# Patient Record
Sex: Male | Born: 1990 | Race: Black or African American | Hispanic: No | Marital: Single | Smoking: Never smoker
Health system: Southern US, Community
[De-identification: ages and names within clinical notes are randomized; demographics above are authoritative.]

## PROBLEM LIST (undated history)

## (undated) DIAGNOSIS — I861 Scrotal varices: Secondary | ICD-10-CM

## (undated) HISTORY — DX: Scrotal varices: I86.1

---
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
## 2011-04-11 ENCOUNTER — Ambulatory Visit (INDEPENDENT_AMBULATORY_CARE_PROVIDER_SITE_OTHER): Payer: Self-pay

## 2011-04-11 ENCOUNTER — Inpatient Hospital Stay (INDEPENDENT_AMBULATORY_CARE_PROVIDER_SITE_OTHER)
Admission: RE | Admit: 2011-04-11 | Discharge: 2011-04-11 | Disposition: A | Discharge status: Home / Self Care | Payer: Self-pay | Source: Ambulatory Visit | Attending: Family Medicine | Admitting: Family Medicine

## 2011-04-11 DIAGNOSIS — S92109A Unspecified fracture of unspecified talus, initial encounter for closed fracture: Secondary | ICD-10-CM

## 2011-04-11 DIAGNOSIS — M79609 Pain in unspecified limb: Secondary | ICD-10-CM

## 2012-12-19 IMAGING — CR DG ANKLE COMPLETE 3+V*L*
3 series · 3 of 3 positions shown · non-contrast
Comparison: None.

CLINICAL DATA: Trauma/fall, lateral ankle pain

LEFT ANKLE COMPLETE - 3+ VIEW

[view not recorded (1 of 3)]
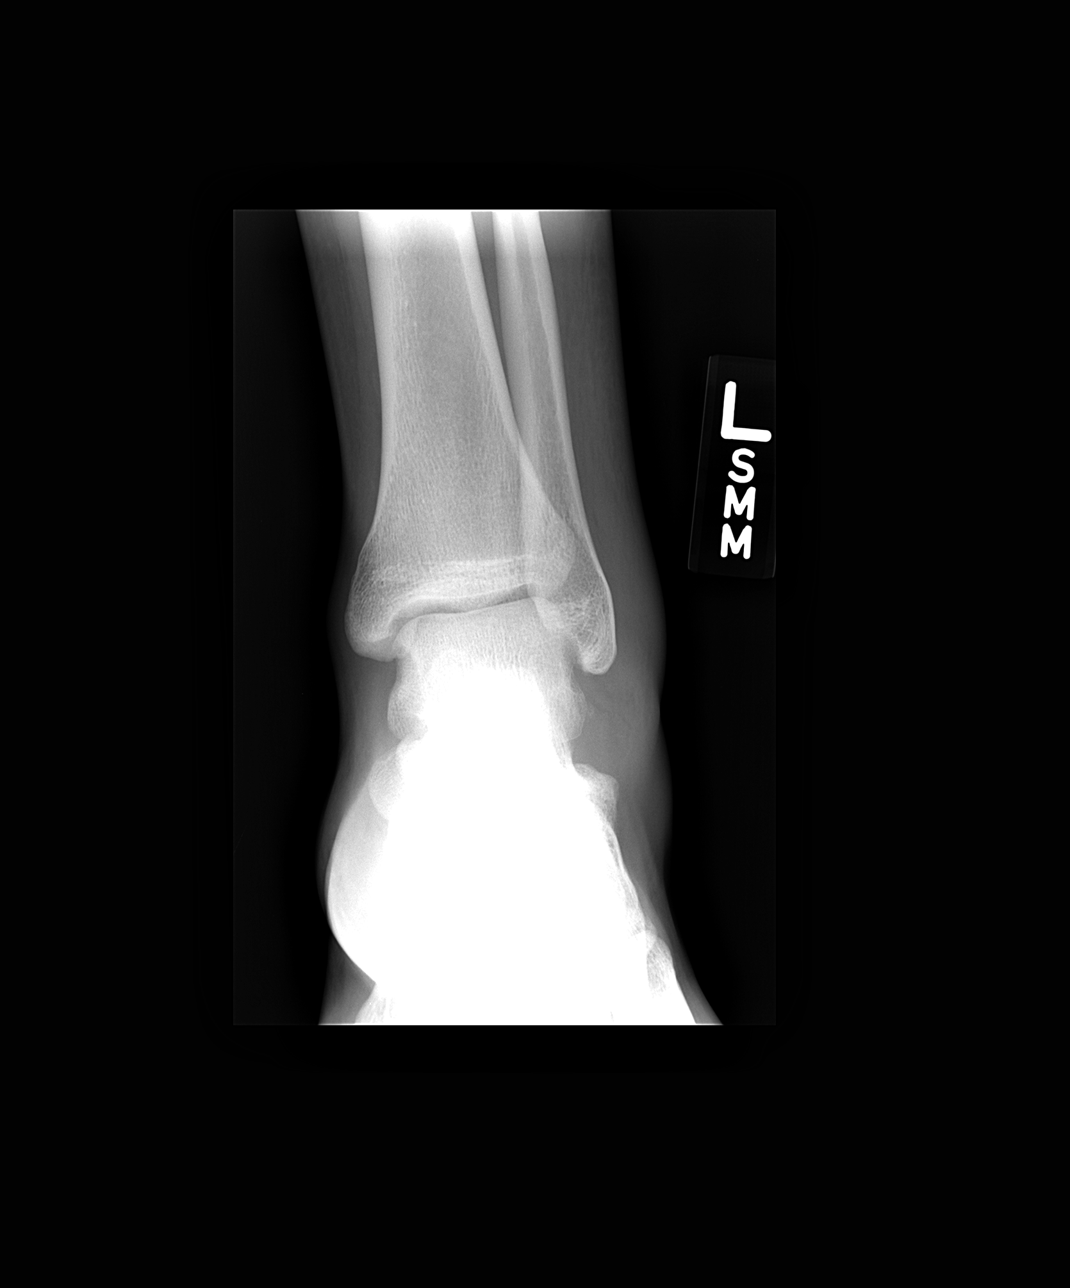

[view not recorded (2 of 3)]
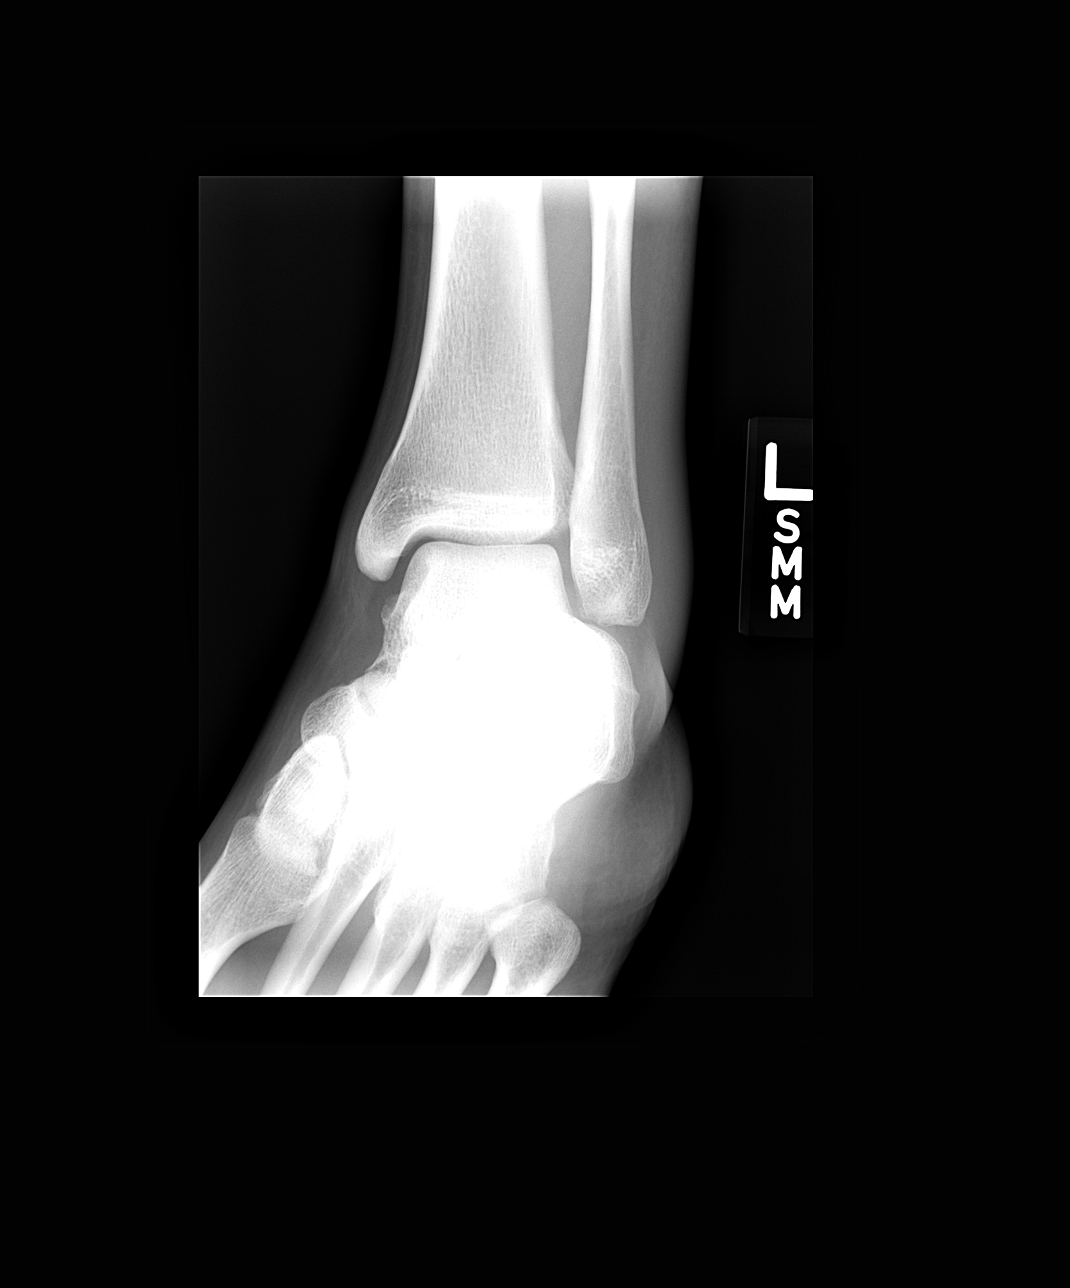

[view not recorded (3 of 3)]
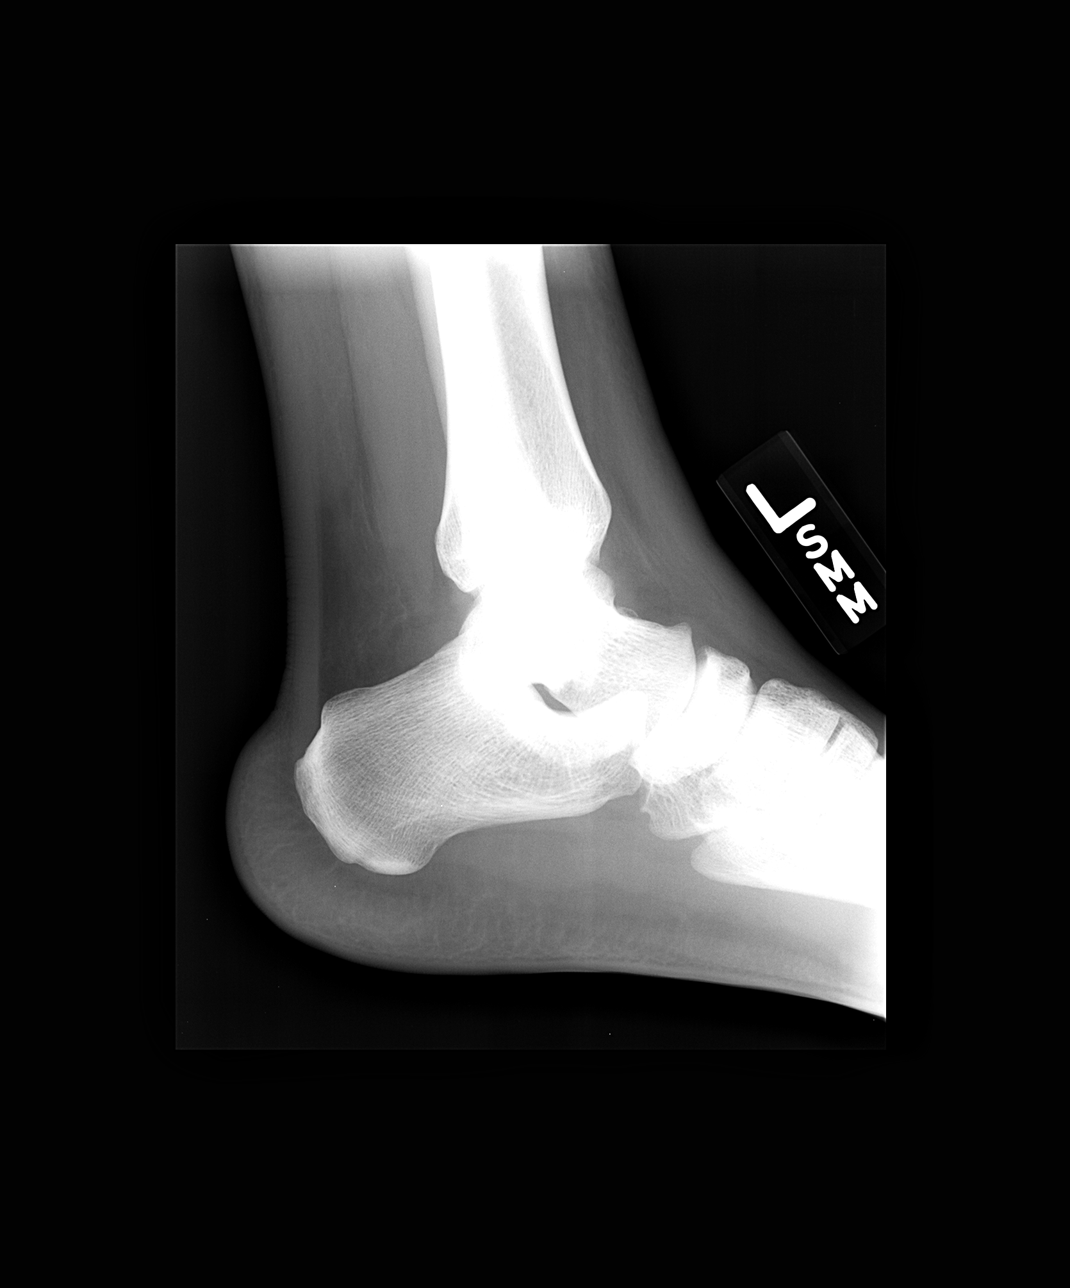

[3 of 3 positions shown; findings below may reference images not displayed]

FINDINGS: Tiny osseous density along the lateral aspect of the
talus, only visible on the frontal view, which could reflect a very
small avulsion fracture.  Associated soft tissue swelling.

Otherwise, no fracture is seen.  The ankle mortise is preserved.
The base of the fifth metatarsal appears intact.
IMPRESSION: Suspected tiny avulsion fracture along the lateral aspect of the
talus.

Associated soft tissue swelling along the lateral ankle.

## 2013-08-15 ENCOUNTER — Ambulatory Visit (INDEPENDENT_AMBULATORY_CARE_PROVIDER_SITE_OTHER): Payer: BC Managed Care – PPO | Admitting: Medical

## 2013-08-15 ENCOUNTER — Encounter: Payer: Self-pay | Admitting: Medical

## 2013-08-15 VITALS — BP 110/80 | HR 58 | Temp 98.2°F | Resp 16 | Ht 68.2 in | Wt 208.0 lb

## 2013-08-15 DIAGNOSIS — I861 Scrotal varices: Secondary | ICD-10-CM

## 2013-08-15 DIAGNOSIS — Z113 Encounter for screening for infections with a predominantly sexual mode of transmission: Secondary | ICD-10-CM

## 2013-08-15 DIAGNOSIS — Z0000 Encounter for general adult medical examination without abnormal findings: Secondary | ICD-10-CM

## 2013-08-15 LAB — CBC
Hemoglobin: 12.7 g/dL — ABNORMAL LOW (ref 13.0–17.0)
MCV: 84.3 fL (ref 78.0–100.0)
Platelets: 366 10*3/uL (ref 150–400)
RBC: 4.58 MIL/uL (ref 4.22–5.81)
WBC: 5.3 10*3/uL (ref 4.0–10.5)

## 2013-08-15 LAB — COMPREHENSIVE METABOLIC PANEL
Albumin: 3.9 g/dL (ref 3.5–5.2)
Alkaline Phosphatase: 36 U/L — ABNORMAL LOW (ref 39–117)
BUN: 8 mg/dL (ref 6–23)
CO2: 28 mEq/L (ref 19–32)
Glucose, Bld: 91 mg/dL (ref 70–99)
Sodium: 136 mEq/L (ref 135–145)
Total Bilirubin: 0.3 mg/dL (ref 0.3–1.2)
Total Protein: 5.8 g/dL — ABNORMAL LOW (ref 6.0–8.3)

## 2013-08-15 LAB — LIPID PANEL: Cholesterol: 143 mg/dL (ref 0–200)

## 2013-08-15 NOTE — Patient Instructions (Signed)
Preventative Care for Adults, Male       REGULAR HEALTH EXAMS:  A routine yearly physical is a good way to check in with your primary care provider about your health and preventive screening. It is also an opportunity to share updates about your health and any concerns you have, and receive a thorough all-over exam.   Most health insurance companies pay for at least some preventative services.  Check with your health plan for specific coverages.  WHAT PREVENTATIVE SERVICES DO MEN NEED?  Adult men should have their weight and blood pressure checked regularly.   Men age 23 and older should have their cholesterol levels checked regularly.  Beginning at age 23 and continuing to age 23, men should be screened for colorectal cancer.  Certain people should may need continued testing until age 23.  Other cancer screening may include exams for testicular and prostate cancer.  Updating vaccinations is part of preventative care.  Vaccinations help protect against diseases such as the flu.  Lab tests are generally done as part of preventative care to screen for anemia and blood disorders, to screen for problems with the kidneys and liver, to screen for bladder problems, to check blood sugar, and to check your cholesterol level.  Preventative services generally include counseling about diet, exercise, avoiding tobacco, drugs, excessive alcohol consumption, and sexually transmitted infections.    GENERAL RECOMMENDATIONS FOR GOOD HEALTH:  Healthy diet:  Eat a variety of foods, including fruit, vegetables, animal or vegetable protein, such as meat, fish, chicken, and eggs, or beans, lentils, tofu, and grains, such as rice.  Drink plenty of water daily.  Decrease saturated fat in the diet, avoid lots of red meat, processed foods, sweets, fast foods, and fried foods.  Exercise:  Aerobic exercise helps maintain good heart health. At least 30-40 minutes of moderate-intensity exercise is recommended.  For example, a brisk walk that increases your heart rate and breathing. This should be done on most days of the week.   Find a type of exercise or a variety of exercises that you enjoy so that it becomes a part of your daily life.  Examples are running, walking, swimming, water aerobics, and biking.  For motivation and support, explore group exercise such as aerobic class, spin class, Zumba, Yoga,or  martial arts, etc.    Set exercise goals for yourself, such as a certain weight goal, walk or run in a race such as a 5k walk/run.  Speak to your primary care provider about exercise goals.  Disease prevention:  If you smoke or chew tobacco, find out from your caregiver how to quit. It can literally save your life, no matter how long you have been a tobacco user. If you do not use tobacco, never begin.   Maintain a healthy diet and normal weight. Increased weight leads to problems with blood pressure and diabetes.   The Body Mass Index or BMI is a way of measuring how much of your body is fat. Having a BMI above 27 increases the risk of heart disease, diabetes, hypertension, stroke and other problems related to obesity. Your caregiver can help determine your BMI and based on it develop an exercise and dietary program to help you achieve or maintain this important measurement at a healthful level.  High blood pressure causes heart and blood vessel problems.  Persistent high blood pressure should be treated with medicine if weight loss and exercise do not work.   Fat and cholesterol leaves deposits in your arteries   that can block them. This causes heart disease and vessel disease elsewhere in your body.  If your cholesterol is found to be high, or if you have heart disease or certain other medical conditions, then you may need to have your cholesterol monitored frequently and be treated with medication.   Ask if you should have a stress test if your history suggests this. A stress test is a test done on  a treadmill that looks for heart disease. This test can find disease prior to there being a problem.  Avoid drinking alcohol in excess (more than two drinks per day).  Avoid use of street drugs. Do not share needles with anyone. Ask for professional help if you need assistance or instructions on stopping the use of alcohol, cigarettes, and/or drugs.  Brush your teeth twice a day with fluoride toothpaste, and floss once a day. Good oral hygiene prevents tooth decay and gum disease. The problems can be painful, unattractive, and can cause other health problems. Visit your dentist for a routine oral and dental check up and preventive care every 6-12 months.   Look at your skin regularly.  Use a mirror to look at your back. Notify your caregivers of changes in moles, especially if there are changes in shapes, colors, a size larger than a pencil eraser, an irregular border, or development of new moles.  Safety:  Use seatbelts 100% of the time, whether driving or as a passenger.  Use safety devices such as hearing protection if you work in environments with loud noise or significant background noise.  Use safety glasses when doing any work that could send debris in to the eyes.  Use a helmet if you ride a bike or motorcycle.  Use appropriate safety gear for contact sports.  Talk to your caregiver about gun safety.  Use sunscreen with a SPF (or skin protection factor) of 15 or greater.  Lighter skinned people are at a greater risk of skin cancer. Don't forget to also wear sunglasses in order to protect your eyes from too much damaging sunlight. Damaging sunlight can accelerate cataract formation.   Practice safe sex. Use condoms. Condoms are used for birth control and to help reduce the spread of sexually transmitted infections (or STIs).  Some of the STIs are gonorrhea (the clap), chlamydia, syphilis, trichomonas, herpes, HPV (human papilloma virus) and HIV (human immunodeficiency virus) which causes AIDS.  The herpes, HIV and HPV are viral illnesses that have no cure. These can result in disability, cancer and death.   Keep carbon monoxide and smoke detectors in your home functioning at all times. Change the batteries every 6 months or use a model that plugs into the wall.   Vaccinations:  Stay up to date with your tetanus shots and other required immunizations. You should have a booster for tetanus every 10 years. Be sure to get your flu shot every year, since 5%-20% of the [place] population comes down with the flu. The flu vaccine changes each year, so being vaccinated once is not enough. Get your shot in the fall, before the flu season peaks.   Other vaccines to consider:  Pneumococcal vaccine to protect against certain types of pneumonia.  This is normally recommended for adults age 65 or older.  However, adults younger than 23 years old with certain underlying conditions such as diabetes, heart or lung disease should also receive the vaccine.  Shingles vaccine to protect against Varicella Zoster if you are older than age 60, or younger   than 23 years old with certain underlying illness.  Hepatitis A vaccine to protect against a form of infection of the liver by a virus acquired from food.  Hepatitis B vaccine to protect against a form of infection of the liver by a virus acquired from blood or body fluids, particularly if you work in health care.  If you plan to travel internationally, check with your local health department for specific vaccination recommendations.  Cancer Screening:  Most routine colon cancer screening begins at the age of 50. On a yearly basis, doctors may provide special easy to use take-home tests to check for hidden blood in the stool. Sigmoidoscopy or colonoscopy can detect the earliest forms of colon cancer and is life saving. These tests use a small camera at the end of a tube to directly examine the colon. Speak to your caregiver about this at age 50, when routine  screening begins (and is repeated every 5 years unless early forms of pre-cancerous polyps or small growths are found).   At the age of 50 men usually start screening for prostate cancer every year. Screening may begin at a younger age for those with higher risk. Those at higher risk include African-Americans or having a family history of prostate cancer. There are two types of tests for prostate cancer:   Prostate-specific antigen (PSA) testing. Recent studies raise questions about prostate cancer using PSA and you should discuss this with your caregiver.   Digital rectal exam (in which your doctor's lubricated and gloved finger feels for enlargement of the prostate through the anus).   Screening for testicular cancer.  Do a monthly exam of your testicles. Gently roll each testicle between your thumb and fingers, feeling for any abnormal lumps. The best time to do this is after a hot shower or bath when the tissues are looser. Notify your caregivers of any lumps, tenderness or changes in size or shape immediately.     

## 2013-08-15 NOTE — Progress Notes (Signed)
Subjective:   HPI  Devon Brown is a 22 y.o. male who presents for a complete physical.  New patient today.   Been in usual state of health.  No c/o.    Preventative care: Last ophthalmology visit:n/a Last dental visit:n/a,  2 years ago Last colonoscopy:n/a Last prostate exam: n/a Last EKG:n/a Last labs: not sure?  Prior vaccinations: TD or Tdap:up to date within last 10 years Influenza:no flu today Pneumococcal:n/a Shingles/Zostavax:n/a  Advanced directive:n/a Health care power of attorney:n/a Living will:n/a  Concerns: Sometimes gets muscle tightness of pains in left hand and lower back.  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  Doesn't take anything for it.  Not currently in pain.  Denies injury, trauma, fall.  Right handed.    Reviewed their medical, surgical, family, social, medication, and allergy history and updated chart as appropriate.  History reviewed. No pertinent past medical history.  History reviewed. No pertinent past surgical history.  History   Social History  . Marital Status: Single    Spouse Name: N/A    Number of Children: N/A  . Years of Education: N/A   Occupational History  . Not on file.   Social History Main Topics  . Smoking status: Never Smoker   . Smokeless tobacco: Not on file  . Alcohol Use: No  . Drug Use: No  . Sexual Activity: Not on file   Other Topics Concern  . 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  Has a girlfriend    Family History  Problem Relation Age of Onset  . Hypertension Mother   . Heart disease Neg Hx   . Cancer Neg Hx   . Diabetes Neg Hx     No current outpatient prescriptions on file.  No Known Allergies    Review of Systems Constitutional: -fever, -chills, -sweats, -unexpected weight change, -decreased appetite, -fatigue Allergy: -sneezing, -itching, -congestion Dermatology:  -changing moles, --rash, -lumps ENT: -runny nose, -ear pain, -sore throat, -hoarseness, -sinus pain, -teeth pain, - ringing in ears, -hearing loss, -nosebleeds Cardiology: -chest pain, -palpitations, -swelling, -difficulty breathing when lying flat, -waking up short of breath Respiratory: -cough, -shortness of breath, -difficulty breathing with exercise or exertion, -wheezing, -coughing up blood Gastroenterology: -abdominal pain, -nausea, -vomiting, -diarrhea, -constipation, -blood in stool, -changes in bowel movement, -difficulty swallowing or eating Hematology: -bleeding, -bruising  Musculoskeletal: -joint aches, -muscle aches, -joint swelling, -back pain, -neck pain, -cramping, -changes in gait, +muscle spasms in back and left hand Ophthalmology: denies vision changes, eye redness, itching, discharge Urology: -burning with urination, -difficulty urinating, -blood in urine, -urinary frequency, -urgency, -incontinence Neurology: -headache, -weakness, -tingling, -numbness, -memory loss, -falls, -dizziness Psychology: -depressed mood, -agitation, -sleep problems     Objective:   Physical Exam  BP 110/80  Pulse 58  Temp(Src) 98.2 F (36.8 C) (Oral)  Resp 16  Ht 5' 8.2" (1.732 m)  Wt 208 lb (94.348 kg)  BMI 31.45 kg/m2  General appearance: alert, no distress, WD/WN, AA male Skin: tattoos right upper and lower arm, right chest wall, linear scar approx 5cm of right upper back, no worrisome lesions HEENT: normocephalic, conjunctiva/corneas normal, sclerae anicteric, PERRLA, EOMi, nares patent, no discharge or erythema, pharynx normal Oral cavity: MMM, tongue normal, teeth in good repair Neck: supple, no lymphadenopathy, no thyromegaly, no masses, normal ROM, no bruits Chest: non tender, normal shape and expansion Heart: RRR, normal S1,  S2, no murmurs Lungs: CTA bilaterally, no wheezes, rhonchi, or rales Abdomen: +bs, soft, non tender, non distended, no masses, no hepatomegaly, no  splenomegaly, no bruits Back: non tender, normal ROM, no scoliosis Musculoskeletal: upper extremities non tender, no obvious deformity, normal ROM throughout, lower extremities non tender, no obvious deformity, normal ROM throughout Extremities: no edema, no cyanosis, no clubbing Pulses: 2+ symmetric, upper and lower extremities, normal cap refill Neurological: alert, oriented x 3, CN2-12 intact, strength normal upper extremities and lower extremities, sensation normal throughout, DTRs 2+ throughout, no cerebellar signs, gait normal Psychiatric: normal affect, behavior normal, pleasant  GU: normal male external genitalia, circumcised, bag of worms findings of left scrotum, otherwise nontender, no masses, no hernia, no lymphadenopathy Rectal: deferred   Assessment and Plan :      Encounter Diagnoses  Name Primary?  . Routine general medical examination at a health care facility Yes  . Screening for STD (sexually transmitted disease)   . Left varicocele     Physical exam - discussed healthy lifestyle, diet, exercise, preventative care, vaccinations, and addressed their concerns.  Advised he see dentist for routine care.   STD screening today, discussed safe sex.  Discussed testicular cancer screening.  Follow-up pending labs.

## 2013-08-16 ENCOUNTER — Other Ambulatory Visit: Payer: Self-pay | Admitting: Medical

## 2013-08-16 LAB — GC/CHLAMYDIA PROBE AMP
CT Probe RNA: POSITIVE — AB
GC Probe RNA: NEGATIVE

## 2013-08-16 LAB — HIV ANTIBODY (ROUTINE TESTING W REFLEX): HIV: NONREACTIVE

## 2013-08-16 MED ORDER — AZITHROMYCIN 1 G PO PACK: 1.0000 g | PACK | Freq: Once | ORAL | Status: DC

## 2013-08-29 ENCOUNTER — Encounter: Payer: Self-pay | Admitting: Medical

## 2013-08-29 ENCOUNTER — Ambulatory Visit (INDEPENDENT_AMBULATORY_CARE_PROVIDER_SITE_OTHER): Payer: BC Managed Care – PPO | Admitting: Medical

## 2013-08-29 VITALS — BP 120/80 | HR 80 | Temp 98.0°F | Resp 18 | Wt 202.0 lb

## 2013-08-29 DIAGNOSIS — A749 Chlamydial infection, unspecified: Secondary | ICD-10-CM

## 2013-08-29 NOTE — Addendum Note (Signed)
Addended by: Janeice Robinson on: 08/29/2013 12:53 PM   Modules accepted: Orders

## 2013-08-29 NOTE — Progress Notes (Signed)
  Subjective:  Devon Brown is a 22 y.o. male who presents for f/u.  At his recent physical he was unexpectedly found to be chlamydia + on screening.  He took the azithromycin, girlfriend was also treated by her health provider, and he is here for repeat testing.  No other aggravating or relieving factors.    No other c/o.  The following portions of the patient's history were reviewed and updated as appropriate: allergies, current medications, past family history, past medical history, past social history, past surgical history and problem list.  ROS Otherwise as in subjective above  Objective: Physical Exam BP 120/80  Pulse 80  Temp(Src) 98 F (36.7 C) (Oral)  Resp 18  Wt 202 lb (91.627 kg)   General appearance: alert, no distress, WD/WN GU: normal male external genitalia, circumcised, bag of worms findings of left scrotum, otherwise nontender, no masses, no hernia, no lymphadenopathy    Assessment: Encounter Diagnosis  Name Primary?  . Chlamydia infection Yes     Plan: He has finished treatment.  We will recheck chlamydia test only today.  Discussed safe sex, prevention,means of STD transmission.  Follow up: pending lab

## 2013-08-30 LAB — GC PROBE AMPLIFICATION, URINE: GC Probe Amp, Urine: NEGATIVE

## 2021-07-28 ENCOUNTER — Other Ambulatory Visit: Payer: Self-pay

## 2021-07-28 ENCOUNTER — Ambulatory Visit (HOSPITAL_COMMUNITY)
Admission: EM | Admit: 2021-07-28 | Discharge: 2021-07-28 | Disposition: A | Discharge status: Home / Self Care | Payer: Federal, State, Local not specified - PPO | Attending: Student | Admitting: Student

## 2021-07-28 ENCOUNTER — Encounter (HOSPITAL_COMMUNITY): Payer: Self-pay

## 2021-07-28 DIAGNOSIS — S46811A Strain of other muscles, fascia and tendons at shoulder and upper arm level, right arm, initial encounter: Secondary | ICD-10-CM

## 2021-07-28 MED ORDER — TIZANIDINE HCL 2 MG PO TABS: 2.0000 mg | ORAL_TABLET | Freq: Three times a day (TID) | ORAL | 0 refills | Status: AC | PRN

## 2021-07-28 NOTE — Discharge Instructions (Addendum)
-  Start the muscle relaxer-Zanaflex (tizanidine), up to 3 times daily for muscle spasms and pain.  This can make you drowsy, so take at bedtime or when you do not need to drive or operate machinery. -You can take Tylenol up to 1000 mg 3 times daily, and ibuprofen up to 600 mg 3 times daily with food.  You can take these together, or alternate every 3-4 hours. -Heating pad, stretching -Follow-up if symptoms persist in 7-10 days
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
